# Patient Record
Sex: Male | Born: 1995
Health system: Southern US, Community
[De-identification: ages and names within clinical notes are randomized; demographics above are authoritative.]

---
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
## 2017-02-06 IMAGING — MR MR FOOT*L* W/O CM
4 of 6 series · 19 of 40 positions shown · non-contrast
Comparison: None.

CLINICAL DATA: Plantar surface pain of the left foot since June 2014. Patient is a runner.

EXAM:
MRI OF THE LEFT FOREFOOT WITHOUT CONTRAST
TECHNIQUE: Multiplanar, multisequence MR imaging of the ankle was performed. No
intravenous contrast was administered.

[Series 3: T2 fat-sat · axial · 4.0mm · 0.31mm/px · z∈[-39,+86]mm · 7 of 26 slices shown (1 of 3)]
[im 1/26]
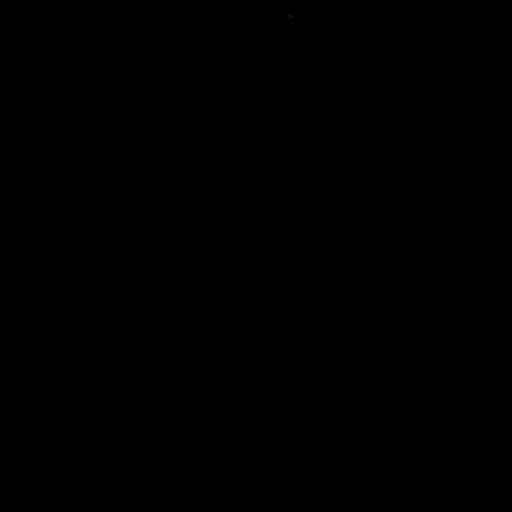
[im 5/26]
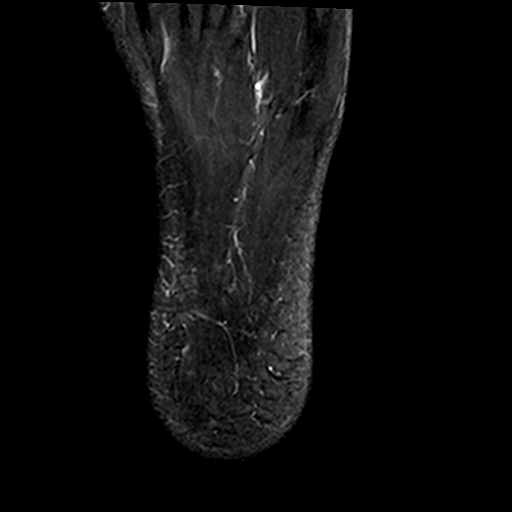
[im 9/26]
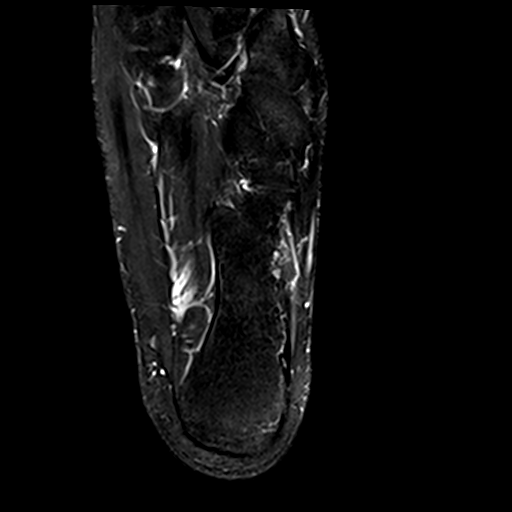
[im 13/26]
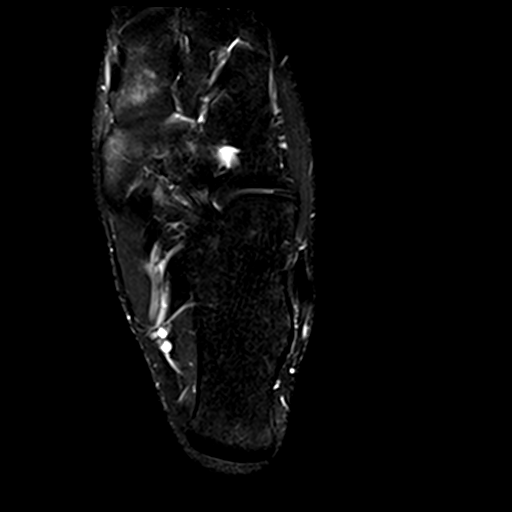
[im 17/26]
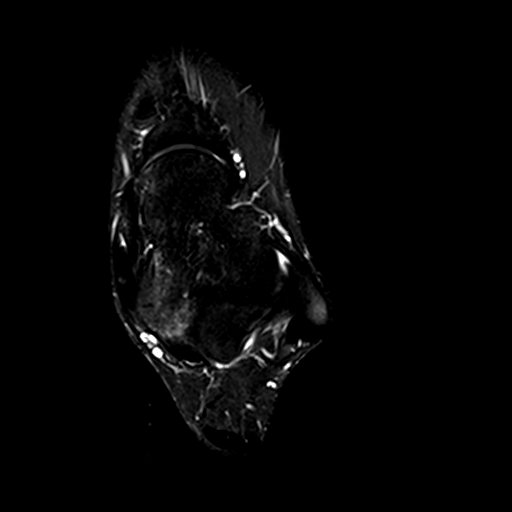
[im 21/26]
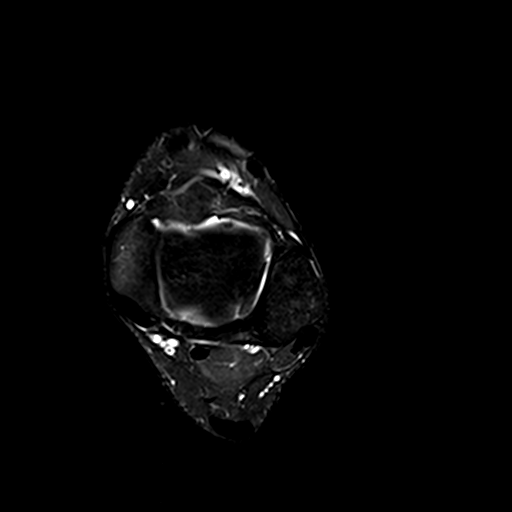
[im 26/26]
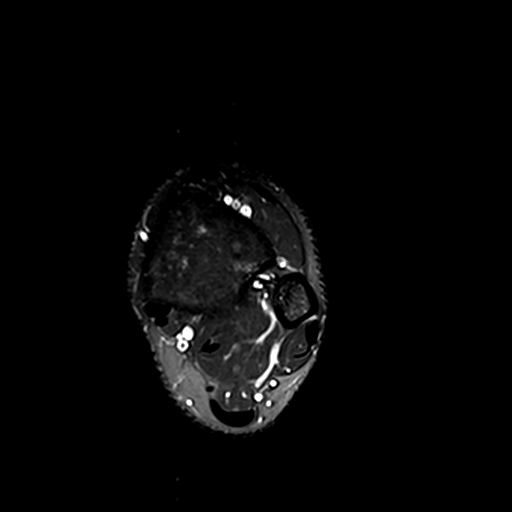

[Series 4: PD fat-sat · axial · 4.0mm · 0.31mm/px · z∈[-39,+86]mm · 6 of 26 slices shown]
[im 1/26]
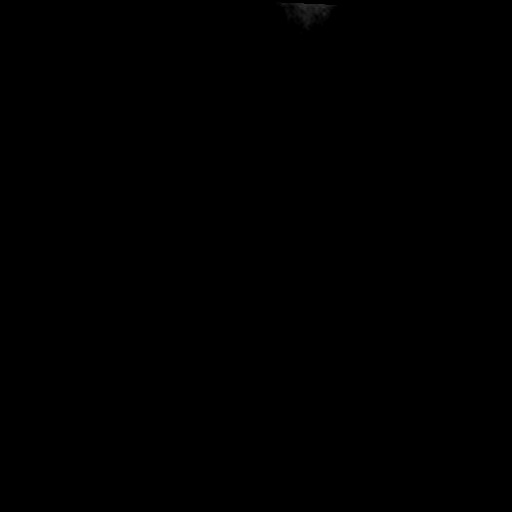
[im 6/26]
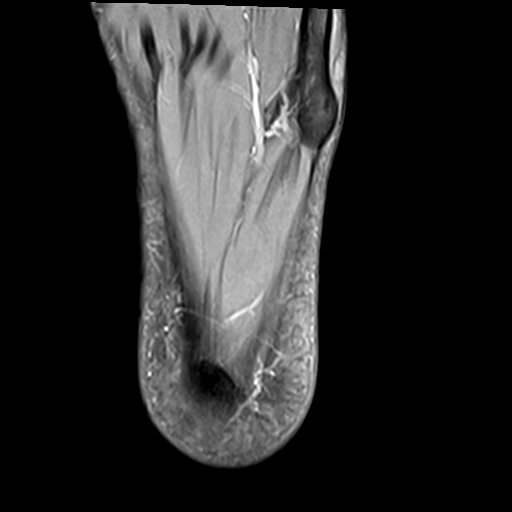
[im 11/26]
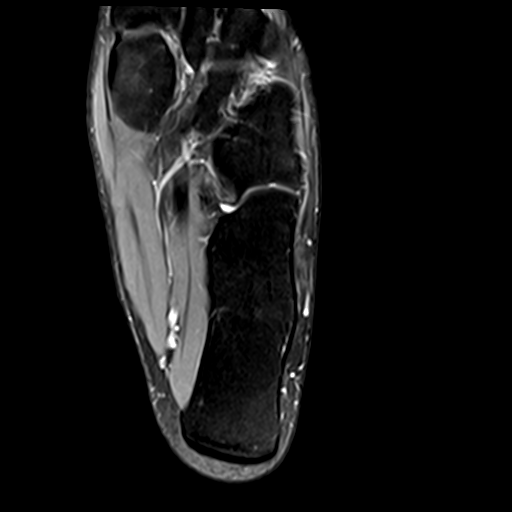
[im 16/26]
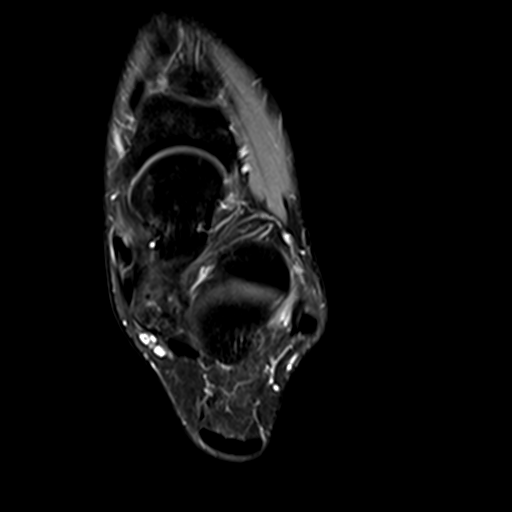
[im 21/26]
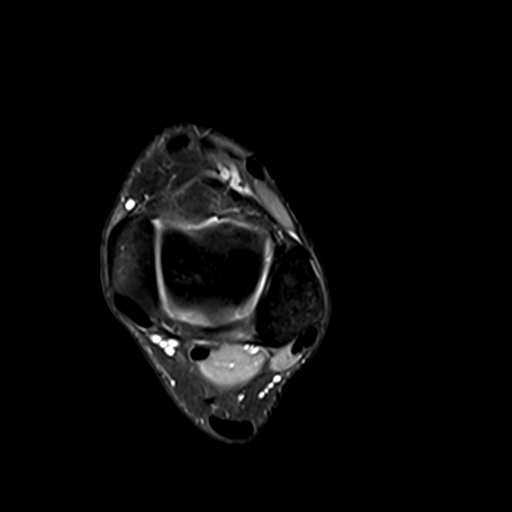
[im 26/26]
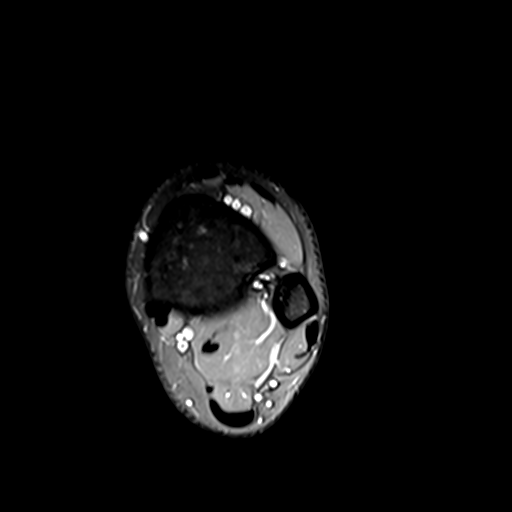

[Series 6: T2 fat-sat · sagittal · 4.0mm · 0.31mm/px · 3 of 21 slices shown (2 of 3)]
[im 1/21]
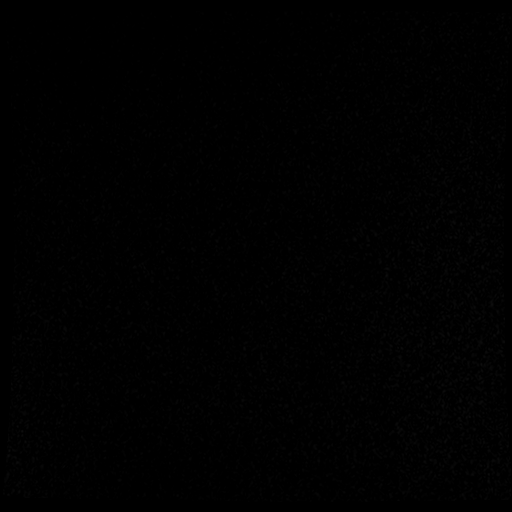
[im 11/21]
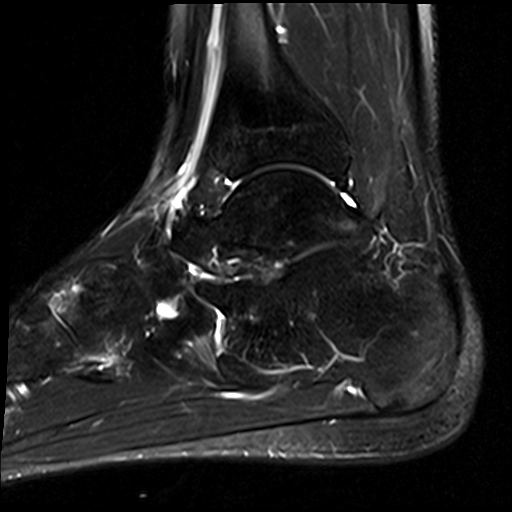
[im 21/21]
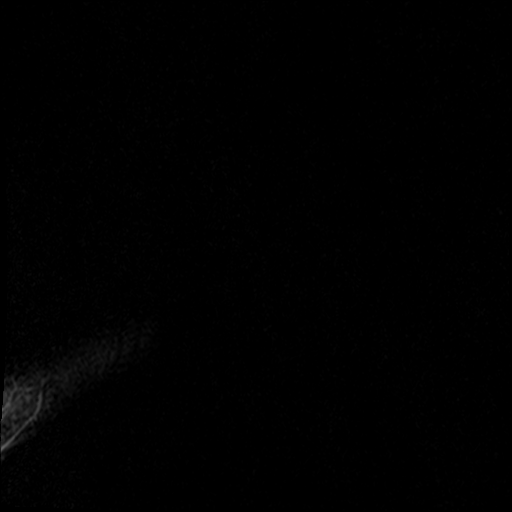

[Series 7: T2 fat-sat · coronal · 4.0mm · 0.31mm/px · 3 of 35 slices shown (3 of 3)]
[im 5/35]
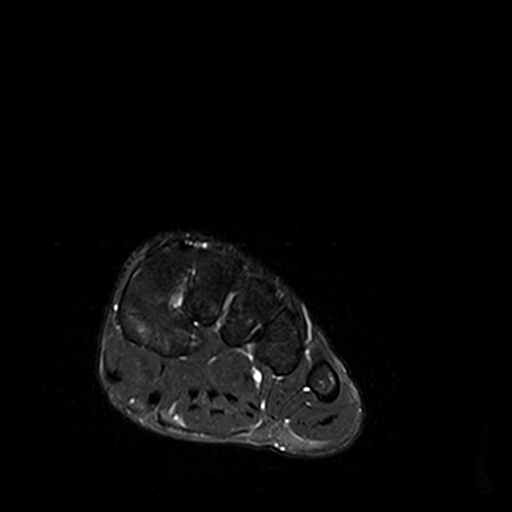
[im 18/35]
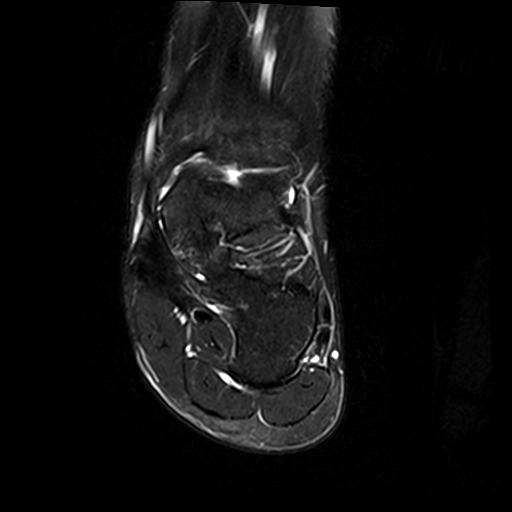
[im 30/35]
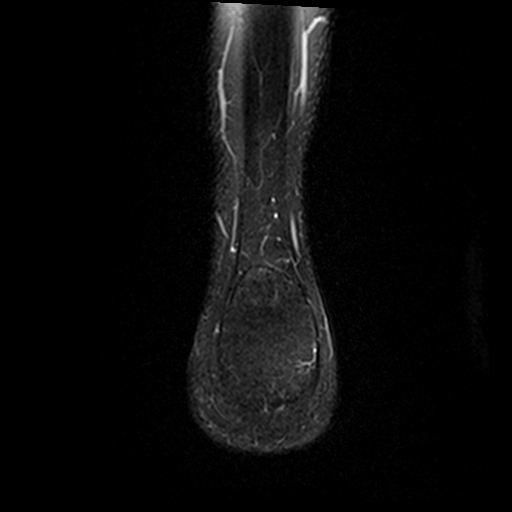

[19 of 40 positions shown; findings below may reference images not displayed]

FINDINGS: TENDONS

Peroneal: Intact.

Posteromedial: Intact.

Anterior: Intact.

Achilles: Intact.

Plantar Fascia: Intact.

LIGAMENTS

Lateral: Intact.

Medial: Intact.

CARTILAGE

Ankle Joint: Normal ankle joint. No osteochondral lesion. No joint
effusion.

Subtalar Joints/Sinus Tarsi: Normal sinus tarsi. Irregularity of the
medial subtalar joint with mild marrow edema on either side of the
joint most consistent with fibrocartilaginous coalition.

Bones: Mild marrow edema at the navicular-medial cuneiform
articulation, which may reflect early osteoarthritic changes. No
other marrow signal abnormality. No fracture or dislocation.
IMPRESSION: 1. Irregularity of the medial subtalar joint with mild marrow edema
on either side of the joint most consistent with fibrocartilaginous
coalition.
2. Mild marrow edema at the navicular-medial cuneiform articulation,
which may reflect early osteoarthritic changes.

## 2020-07-10 DIAGNOSIS — M79644 Pain in right finger(s): Secondary | ICD-10-CM | POA: Diagnosis not present

## 2020-09-02 ENCOUNTER — Other Ambulatory Visit (HOSPITAL_COMMUNITY): Payer: Self-pay

## 2020-09-02 MED ORDER — CARESTART COVID-19 HOME TEST VI KIT
PACK | 0 refills | Status: AC
  Filled 2020-09-02: qty 4, 4d supply, fill #0

## 2020-09-03 ENCOUNTER — Other Ambulatory Visit (HOSPITAL_COMMUNITY): Payer: Self-pay

## 2021-04-16 ENCOUNTER — Other Ambulatory Visit (HOSPITAL_COMMUNITY): Payer: Self-pay

## 2021-04-16 MED ORDER — CARESTART COVID-19 HOME TEST VI KIT
PACK | 0 refills | Status: AC
  Filled 2021-04-16: qty 4, 4d supply, fill #0
